# Patient Record
Sex: Male | Born: 1950
Health system: Southern US, Community
[De-identification: ages and names within clinical notes are randomized; demographics above are authoritative.]

## PROBLEM LIST (undated history)

## (undated) DIAGNOSIS — E785 Hyperlipidemia, unspecified: Secondary | ICD-10-CM

## (undated) DIAGNOSIS — I10 Essential (primary) hypertension: Secondary | ICD-10-CM

## (undated) DIAGNOSIS — R7303 Prediabetes: Secondary | ICD-10-CM

## (undated) DIAGNOSIS — R931 Abnormal findings on diagnostic imaging of heart and coronary circulation: Secondary | ICD-10-CM

## (undated) HISTORY — DX: Essential (primary) hypertension: I10

## (undated) HISTORY — DX: Abnormal findings on diagnostic imaging of heart and coronary circulation: R93.1

## (undated) HISTORY — DX: Hyperlipidemia, unspecified: E78.5

## (undated) HISTORY — DX: Prediabetes: R73.03

---

## 2012-05-06 ENCOUNTER — Other Ambulatory Visit: Payer: Self-pay | Admitting: Family Medicine

## 2012-05-06 DIAGNOSIS — R1310 Dysphagia, unspecified: Secondary | ICD-10-CM

## 2012-05-15 ENCOUNTER — Other Ambulatory Visit: Payer: Self-pay

## 2012-05-15 ENCOUNTER — Ambulatory Visit
Admission: RE | Admit: 2012-05-15 | Discharge: 2012-05-15 | Disposition: A | Discharge status: Home / Self Care | Payer: PRIVATE HEALTH INSURANCE | Source: Ambulatory Visit | Attending: Family Medicine | Admitting: Family Medicine

## 2012-05-15 DIAGNOSIS — R1310 Dysphagia, unspecified: Secondary | ICD-10-CM

## 2016-08-24 DIAGNOSIS — H26491 Other secondary cataract, right eye: Secondary | ICD-10-CM | POA: Diagnosis not present

## 2016-08-24 DIAGNOSIS — H43811 Vitreous degeneration, right eye: Secondary | ICD-10-CM | POA: Diagnosis not present

## 2016-08-24 DIAGNOSIS — H5310 Unspecified subjective visual disturbances: Secondary | ICD-10-CM | POA: Diagnosis not present

## 2016-08-24 DIAGNOSIS — H353111 Nonexudative age-related macular degeneration, right eye, early dry stage: Secondary | ICD-10-CM | POA: Diagnosis not present

## 2016-08-29 DIAGNOSIS — F411 Generalized anxiety disorder: Secondary | ICD-10-CM | POA: Diagnosis not present

## 2016-08-29 DIAGNOSIS — K469 Unspecified abdominal hernia without obstruction or gangrene: Secondary | ICD-10-CM | POA: Diagnosis not present

## 2016-08-29 DIAGNOSIS — Z125 Encounter for screening for malignant neoplasm of prostate: Secondary | ICD-10-CM | POA: Diagnosis not present

## 2016-08-29 DIAGNOSIS — Z23 Encounter for immunization: Secondary | ICD-10-CM | POA: Diagnosis not present

## 2016-08-29 DIAGNOSIS — R7303 Prediabetes: Secondary | ICD-10-CM | POA: Diagnosis not present

## 2016-08-29 DIAGNOSIS — E782 Mixed hyperlipidemia: Secondary | ICD-10-CM | POA: Diagnosis not present

## 2016-08-29 DIAGNOSIS — I10 Essential (primary) hypertension: Secondary | ICD-10-CM | POA: Diagnosis not present

## 2016-08-29 DIAGNOSIS — Z0000 Encounter for general adult medical examination without abnormal findings: Secondary | ICD-10-CM | POA: Diagnosis not present

## 2016-08-29 DIAGNOSIS — Z7689 Persons encountering health services in other specified circumstances: Secondary | ICD-10-CM | POA: Diagnosis not present

## 2016-08-29 DIAGNOSIS — N486 Induration penis plastica: Secondary | ICD-10-CM | POA: Diagnosis not present

## 2016-08-29 DIAGNOSIS — J309 Allergic rhinitis, unspecified: Secondary | ICD-10-CM | POA: Diagnosis not present

## 2016-08-29 DIAGNOSIS — K210 Gastro-esophageal reflux disease with esophagitis: Secondary | ICD-10-CM | POA: Diagnosis not present

## 2016-10-02 DIAGNOSIS — H43811 Vitreous degeneration, right eye: Secondary | ICD-10-CM | POA: Diagnosis not present

## 2016-10-16 DIAGNOSIS — J329 Chronic sinusitis, unspecified: Secondary | ICD-10-CM | POA: Diagnosis not present

## 2017-02-14 DIAGNOSIS — E782 Mixed hyperlipidemia: Secondary | ICD-10-CM | POA: Diagnosis not present

## 2017-02-14 DIAGNOSIS — I10 Essential (primary) hypertension: Secondary | ICD-10-CM | POA: Diagnosis not present

## 2017-02-14 DIAGNOSIS — R7303 Prediabetes: Secondary | ICD-10-CM | POA: Diagnosis not present

## 2017-03-06 DIAGNOSIS — H43812 Vitreous degeneration, left eye: Secondary | ICD-10-CM | POA: Diagnosis not present

## 2017-06-24 DIAGNOSIS — Z961 Presence of intraocular lens: Secondary | ICD-10-CM | POA: Diagnosis not present

## 2017-06-24 DIAGNOSIS — H35363 Drusen (degenerative) of macula, bilateral: Secondary | ICD-10-CM | POA: Diagnosis not present

## 2017-09-17 DIAGNOSIS — K4090 Unilateral inguinal hernia, without obstruction or gangrene, not specified as recurrent: Secondary | ICD-10-CM | POA: Diagnosis not present

## 2017-09-17 DIAGNOSIS — J309 Allergic rhinitis, unspecified: Secondary | ICD-10-CM | POA: Diagnosis not present

## 2017-09-17 DIAGNOSIS — I10 Essential (primary) hypertension: Secondary | ICD-10-CM | POA: Diagnosis not present

## 2017-09-17 DIAGNOSIS — N486 Induration penis plastica: Secondary | ICD-10-CM | POA: Diagnosis not present

## 2017-09-17 DIAGNOSIS — K210 Gastro-esophageal reflux disease with esophagitis: Secondary | ICD-10-CM | POA: Diagnosis not present

## 2017-09-17 DIAGNOSIS — Z125 Encounter for screening for malignant neoplasm of prostate: Secondary | ICD-10-CM | POA: Diagnosis not present

## 2017-09-17 DIAGNOSIS — Z0000 Encounter for general adult medical examination without abnormal findings: Secondary | ICD-10-CM | POA: Diagnosis not present

## 2017-09-17 DIAGNOSIS — R69 Illness, unspecified: Secondary | ICD-10-CM | POA: Diagnosis not present

## 2017-09-17 DIAGNOSIS — R7303 Prediabetes: Secondary | ICD-10-CM | POA: Diagnosis not present

## 2017-09-17 DIAGNOSIS — E782 Mixed hyperlipidemia: Secondary | ICD-10-CM | POA: Diagnosis not present

## 2017-10-26 DIAGNOSIS — E785 Hyperlipidemia, unspecified: Secondary | ICD-10-CM | POA: Diagnosis not present

## 2017-10-26 DIAGNOSIS — I10 Essential (primary) hypertension: Secondary | ICD-10-CM | POA: Diagnosis not present

## 2017-10-26 DIAGNOSIS — Z6830 Body mass index (BMI) 30.0-30.9, adult: Secondary | ICD-10-CM | POA: Diagnosis not present

## 2017-10-26 DIAGNOSIS — Z7982 Long term (current) use of aspirin: Secondary | ICD-10-CM | POA: Diagnosis not present

## 2017-10-26 DIAGNOSIS — Z823 Family history of stroke: Secondary | ICD-10-CM | POA: Diagnosis not present

## 2017-10-26 DIAGNOSIS — E669 Obesity, unspecified: Secondary | ICD-10-CM | POA: Diagnosis not present

## 2017-10-26 DIAGNOSIS — Z8249 Family history of ischemic heart disease and other diseases of the circulatory system: Secondary | ICD-10-CM | POA: Diagnosis not present

## 2017-10-26 DIAGNOSIS — J309 Allergic rhinitis, unspecified: Secondary | ICD-10-CM | POA: Diagnosis not present

## 2018-03-28 DIAGNOSIS — Z23 Encounter for immunization: Secondary | ICD-10-CM | POA: Diagnosis not present

## 2018-07-01 DIAGNOSIS — H26491 Other secondary cataract, right eye: Secondary | ICD-10-CM | POA: Diagnosis not present

## 2018-07-01 DIAGNOSIS — H353131 Nonexudative age-related macular degeneration, bilateral, early dry stage: Secondary | ICD-10-CM | POA: Diagnosis not present

## 2018-10-15 DIAGNOSIS — J309 Allergic rhinitis, unspecified: Secondary | ICD-10-CM | POA: Diagnosis not present

## 2018-10-15 DIAGNOSIS — E782 Mixed hyperlipidemia: Secondary | ICD-10-CM | POA: Diagnosis not present

## 2018-10-15 DIAGNOSIS — R7303 Prediabetes: Secondary | ICD-10-CM | POA: Diagnosis not present

## 2018-10-15 DIAGNOSIS — Z0000 Encounter for general adult medical examination without abnormal findings: Secondary | ICD-10-CM | POA: Diagnosis not present

## 2018-10-15 DIAGNOSIS — I10 Essential (primary) hypertension: Secondary | ICD-10-CM | POA: Diagnosis not present

## 2018-10-15 DIAGNOSIS — N528 Other male erectile dysfunction: Secondary | ICD-10-CM | POA: Diagnosis not present

## 2019-03-28 DIAGNOSIS — Z23 Encounter for immunization: Secondary | ICD-10-CM | POA: Diagnosis not present

## 2019-05-09 DIAGNOSIS — Z20828 Contact with and (suspected) exposure to other viral communicable diseases: Secondary | ICD-10-CM | POA: Diagnosis not present

## 2019-05-12 ENCOUNTER — Other Ambulatory Visit: Payer: Self-pay

## 2019-05-12 DIAGNOSIS — Z20822 Contact with and (suspected) exposure to covid-19: Secondary | ICD-10-CM

## 2019-05-14 LAB — NOVEL CORONAVIRUS, NAA: SARS-CoV-2, NAA: NOT DETECTED

## 2019-07-07 DIAGNOSIS — H18593 Other hereditary corneal dystrophies, bilateral: Secondary | ICD-10-CM | POA: Diagnosis not present

## 2019-07-07 DIAGNOSIS — Z961 Presence of intraocular lens: Secondary | ICD-10-CM | POA: Diagnosis not present

## 2019-07-07 DIAGNOSIS — H353131 Nonexudative age-related macular degeneration, bilateral, early dry stage: Secondary | ICD-10-CM | POA: Diagnosis not present

## 2019-07-07 DIAGNOSIS — H26493 Other secondary cataract, bilateral: Secondary | ICD-10-CM | POA: Diagnosis not present

## 2019-07-14 ENCOUNTER — Ambulatory Visit: Payer: Medicare Other | Attending: Internal Medicine

## 2019-07-14 ENCOUNTER — Other Ambulatory Visit: Payer: Self-pay

## 2019-07-14 DIAGNOSIS — Z23 Encounter for immunization: Secondary | ICD-10-CM | POA: Insufficient documentation

## 2019-07-14 NOTE — Progress Notes (Signed)
   Covid-19 Vaccination Clinic  Name:  Thomas Roach    MRN: [id number] DOB: [birth year]  07/14/2019  Thomas Roach was observed post Covid-19 immunization for 15 minutes without incidence. He was provided with Vaccine Information Sheet and instruction to access the V-Safe system.   Thomas Roach was instructed to call 911 with any severe reactions post vaccine: Marland Kitchen Difficulty breathing  . Swelling of your face and throat  . A fast heartbeat  . A bad rash all over your body  . Dizziness and weakness    Immunizations Administered    Name Date Dose VIS Date Route   Pfizer COVID-19 Vaccine 07/14/2019  1:16 PM 0.3 mL 06/05/2019 Intramuscular   Manufacturer: [place]   Lot: [id number]   [place]: [id number]

## 2019-08-05 ENCOUNTER — Ambulatory Visit: Payer: Medicare HMO | Attending: Internal Medicine

## 2019-08-05 DIAGNOSIS — Z23 Encounter for immunization: Secondary | ICD-10-CM

## 2019-08-05 NOTE — Progress Notes (Signed)
   Covid-19 Vaccination Clinic  Name:  Thomas Roach    MRN: [id number] DOB: Aug 02, 1950  08/05/2019  [name] was observed post Covid-19 immunization for 15 minutes without incidence. He was provided with Vaccine Information Sheet and instruction to access the V-Safe system.   [name] was instructed to call 911 with any severe reactions post vaccine: Marland Kitchen Difficulty breathing  . Swelling of your face and throat  . A fast heartbeat  . A bad rash all over your body  . Dizziness and weakness    Immunizations Administered    Name Date Dose VIS Date Route   Pfizer COVID-19 Vaccine 08/05/2019  8:30 AM 0.3 mL 06/05/2019 Intramuscular   Manufacturer: [place]   Lot: [id number]   [place]: [id number]

## 2019-08-07 ENCOUNTER — Ambulatory Visit: Payer: PRIVATE HEALTH INSURANCE

## 2019-10-16 DIAGNOSIS — M25552 Pain in left hip: Secondary | ICD-10-CM | POA: Diagnosis not present

## 2019-10-16 DIAGNOSIS — J309 Allergic rhinitis, unspecified: Secondary | ICD-10-CM | POA: Diagnosis not present

## 2019-10-16 DIAGNOSIS — D225 Melanocytic nevi of trunk: Secondary | ICD-10-CM | POA: Diagnosis not present

## 2019-10-16 DIAGNOSIS — R7309 Other abnormal glucose: Secondary | ICD-10-CM | POA: Diagnosis not present

## 2019-10-16 DIAGNOSIS — E782 Mixed hyperlipidemia: Secondary | ICD-10-CM | POA: Diagnosis not present

## 2019-10-16 DIAGNOSIS — Z0000 Encounter for general adult medical examination without abnormal findings: Secondary | ICD-10-CM | POA: Diagnosis not present

## 2019-10-16 DIAGNOSIS — L989 Disorder of the skin and subcutaneous tissue, unspecified: Secondary | ICD-10-CM | POA: Diagnosis not present

## 2019-10-16 DIAGNOSIS — Z125 Encounter for screening for malignant neoplasm of prostate: Secondary | ICD-10-CM | POA: Diagnosis not present

## 2019-10-16 DIAGNOSIS — I10 Essential (primary) hypertension: Secondary | ICD-10-CM | POA: Diagnosis not present

## 2019-10-16 DIAGNOSIS — N528 Other male erectile dysfunction: Secondary | ICD-10-CM | POA: Diagnosis not present

## 2019-11-18 DIAGNOSIS — H0012 Chalazion right lower eyelid: Secondary | ICD-10-CM | POA: Diagnosis not present

## 2019-11-25 DIAGNOSIS — L821 Other seborrheic keratosis: Secondary | ICD-10-CM | POA: Diagnosis not present

## 2019-11-25 DIAGNOSIS — D2261 Melanocytic nevi of right upper limb, including shoulder: Secondary | ICD-10-CM | POA: Diagnosis not present

## 2019-11-25 DIAGNOSIS — L814 Other melanin hyperpigmentation: Secondary | ICD-10-CM | POA: Diagnosis not present

## 2019-11-25 DIAGNOSIS — D692 Other nonthrombocytopenic purpura: Secondary | ICD-10-CM | POA: Diagnosis not present

## 2019-11-25 DIAGNOSIS — D2371 Other benign neoplasm of skin of right lower limb, including hip: Secondary | ICD-10-CM | POA: Diagnosis not present

## 2019-11-25 DIAGNOSIS — D225 Melanocytic nevi of trunk: Secondary | ICD-10-CM | POA: Diagnosis not present

## 2019-11-25 DIAGNOSIS — D2271 Melanocytic nevi of right lower limb, including hip: Secondary | ICD-10-CM | POA: Diagnosis not present

## 2019-11-25 DIAGNOSIS — D2262 Melanocytic nevi of left upper limb, including shoulder: Secondary | ICD-10-CM | POA: Diagnosis not present

## 2020-02-17 DIAGNOSIS — Z20822 Contact with and (suspected) exposure to covid-19: Secondary | ICD-10-CM | POA: Diagnosis not present

## 2020-04-02 DIAGNOSIS — Z23 Encounter for immunization: Secondary | ICD-10-CM | POA: Diagnosis not present

## 2020-04-23 ENCOUNTER — Ambulatory Visit: Payer: Medicare HMO | Attending: Internal Medicine

## 2020-04-23 ENCOUNTER — Other Ambulatory Visit: Payer: Self-pay

## 2020-04-23 DIAGNOSIS — Z23 Encounter for immunization: Secondary | ICD-10-CM

## 2020-04-23 NOTE — Progress Notes (Signed)
   Covid-19 Vaccination Clinic  Name:  Thomas Roach    MRN: [id number] DOB: [birth year]  04/23/2020  Mr. Thomas Roach was observed post Covid-19 immunization for 15 minutes without incident. He was provided with Vaccine Information Sheet and instruction to access the V-Safe system.   Mr. Thomas Roach was instructed to call 911 with any severe reactions post vaccine: Marland Kitchen Difficulty breathing  . Swelling of face and throat  . A fast heartbeat  . A bad rash all over body  . Dizziness and weakness

## 2020-07-12 DIAGNOSIS — H26492 Other secondary cataract, left eye: Secondary | ICD-10-CM | POA: Diagnosis not present

## 2020-07-12 DIAGNOSIS — Z961 Presence of intraocular lens: Secondary | ICD-10-CM | POA: Diagnosis not present

## 2020-10-24 DIAGNOSIS — R7303 Prediabetes: Secondary | ICD-10-CM | POA: Diagnosis not present

## 2020-10-24 DIAGNOSIS — J309 Allergic rhinitis, unspecified: Secondary | ICD-10-CM | POA: Diagnosis not present

## 2020-10-24 DIAGNOSIS — I10 Essential (primary) hypertension: Secondary | ICD-10-CM | POA: Diagnosis not present

## 2020-10-24 DIAGNOSIS — Z1211 Encounter for screening for malignant neoplasm of colon: Secondary | ICD-10-CM | POA: Diagnosis not present

## 2020-10-24 DIAGNOSIS — S80219A Abrasion, unspecified knee, initial encounter: Secondary | ICD-10-CM | POA: Diagnosis not present

## 2020-10-24 DIAGNOSIS — R002 Palpitations: Secondary | ICD-10-CM | POA: Diagnosis not present

## 2020-10-24 DIAGNOSIS — Z125 Encounter for screening for malignant neoplasm of prostate: Secondary | ICD-10-CM | POA: Diagnosis not present

## 2020-10-24 DIAGNOSIS — N528 Other male erectile dysfunction: Secondary | ICD-10-CM | POA: Diagnosis not present

## 2020-10-24 DIAGNOSIS — Z0000 Encounter for general adult medical examination without abnormal findings: Secondary | ICD-10-CM | POA: Diagnosis not present

## 2020-10-24 DIAGNOSIS — E782 Mixed hyperlipidemia: Secondary | ICD-10-CM | POA: Diagnosis not present

## 2020-10-24 DIAGNOSIS — Z23 Encounter for immunization: Secondary | ICD-10-CM | POA: Diagnosis not present

## 2020-10-25 ENCOUNTER — Other Ambulatory Visit: Payer: Self-pay | Admitting: Family Medicine

## 2020-10-25 DIAGNOSIS — R002 Palpitations: Secondary | ICD-10-CM

## 2020-11-02 DIAGNOSIS — H26492 Other secondary cataract, left eye: Secondary | ICD-10-CM | POA: Diagnosis not present

## 2020-11-18 ENCOUNTER — Ambulatory Visit
Admission: RE | Admit: 2020-11-18 | Discharge: 2020-11-18 | Disposition: A | Discharge status: Home / Self Care | Payer: No Typology Code available for payment source | Source: Ambulatory Visit | Attending: Family Medicine | Admitting: Family Medicine

## 2020-11-18 DIAGNOSIS — R002 Palpitations: Secondary | ICD-10-CM

## 2020-11-18 DIAGNOSIS — E785 Hyperlipidemia, unspecified: Secondary | ICD-10-CM | POA: Diagnosis not present

## 2021-03-27 ENCOUNTER — Telehealth (INDEPENDENT_AMBULATORY_CARE_PROVIDER_SITE_OTHER): Payer: Medicare HMO | Admitting: Internal Medicine

## 2021-03-27 ENCOUNTER — Encounter (HOSPITAL_BASED_OUTPATIENT_CLINIC_OR_DEPARTMENT_OTHER): Payer: Self-pay | Admitting: Internal Medicine

## 2021-03-27 ENCOUNTER — Other Ambulatory Visit: Payer: Self-pay | Admitting: Family Medicine

## 2021-03-27 VITALS — Ht 70.0 in | Wt 170.0 lb

## 2021-03-27 DIAGNOSIS — E782 Mixed hyperlipidemia: Secondary | ICD-10-CM

## 2021-03-27 DIAGNOSIS — I10 Essential (primary) hypertension: Secondary | ICD-10-CM

## 2021-03-27 DIAGNOSIS — I2510 Atherosclerotic heart disease of native coronary artery without angina pectoris: Secondary | ICD-10-CM | POA: Diagnosis not present

## 2021-03-27 DIAGNOSIS — I2584 Coronary atherosclerosis due to calcified coronary lesion: Secondary | ICD-10-CM | POA: Diagnosis not present

## 2021-03-27 DIAGNOSIS — R7303 Prediabetes: Secondary | ICD-10-CM | POA: Diagnosis not present

## 2021-03-27 DIAGNOSIS — R1310 Dysphagia, unspecified: Secondary | ICD-10-CM

## 2021-03-27 NOTE — Patient Instructions (Signed)
Medication Instructions:  NO CHANGES until after lab results/genetic test  *If you need a refill on your cardiac medications before your next appointment, please call your pharmacy*   Lab Work: FASTING lab work to check cholesterol - NMR lipoprofile, LP(a)  If you have labs (blood work) drawn today and your tests are completely normal, you will receive your results only by: [place] (if you have MyChart) OR A paper copy in the mail If you have any lab test that is abnormal or we need to change your treatment, we will call you to review the results.   Testing/Procedures: Genetic Test (will be scheduled with Clarnce Flock., RN @ [address] [place])   Follow-Up: At [facility], you and your health needs are our priority.  As part of our continuing mission to provide you with exceptional heart care, we have created designated Provider Care Teams.  These Care Teams include your primary Cardiologist (physician) and Advanced Practice Providers (APPs -  Physician Assistants and Nurse Practitioners) who all work together to provide you with the care you need, when you need it.  We recommend signing up for the patient portal called "MyChart".  Sign up information is provided on this After Visit Summary.  MyChart is used to connect with patients for Virtual Visits (Telemedicine).  Patients are able to view lab/test results, encounter notes, upcoming appointments, etc.  Non-urgent messages can be sent to your provider as well.   To learn more about what you can do with MyChart, go to [email or web address].    Your next appointment:   4 month(s) - lipid clinic  The format for your next appointment:   In Person  Provider:   K. Mali Hilty, MD   Other Instructions

## 2021-03-29 ENCOUNTER — Ambulatory Visit
Admission: RE | Admit: 2021-03-29 | Discharge: 2021-03-29 | Disposition: A | Discharge status: Home / Self Care | Payer: Medicare HMO | Source: Ambulatory Visit | Attending: Family Medicine | Admitting: Family Medicine

## 2021-03-29 DIAGNOSIS — R1310 Dysphagia, unspecified: Secondary | ICD-10-CM

## 2021-03-29 DIAGNOSIS — R1312 Dysphagia, oropharyngeal phase: Secondary | ICD-10-CM | POA: Diagnosis not present

## 2021-03-31 ENCOUNTER — Encounter (HOSPITAL_BASED_OUTPATIENT_CLINIC_OR_DEPARTMENT_OTHER): Payer: Self-pay | Admitting: Internal Medicine

## 2021-03-31 NOTE — Progress Notes (Addendum)
Virtual Visit via Video Note   This visit type was conducted due to national recommendations for restrictions regarding the COVID-19 Pandemic (e.g. social distancing) in an effort to limit this patient's exposure and mitigate transmission in our community.  Due to his co-morbid illnesses, this patient is at least at moderate risk for complications without adequate follow up.  This format is felt to be most appropriate for this patient at this time.  All issues noted in this document were discussed and addressed.  A limited physical exam was performed with this format.  Please refer to the patient's chart for his consent to telehealth for [facility].      Date:  03/31/2021   ID:  Thomas Roach, DOB 1951/03/05, MRN [id number] The patient was identified using 2 identifiers.  Evaluation Performed:  New Patient Evaluation  Patient Location:  [place] [address]  Provider location:   [address], [place], [place] [address]  PCP:  Mayra Neer, MD  Cardiologist:  None Electrophysiologist:  None   Chief Complaint: Abnormal calcium score  History of Present Illness:    Thomas Roach is a 70 y.o. male who presents via audio/video conferencing for a telehealth visit today.  Thomas Roach is being seen today for the evaluation of abnormal calcium score at the request of Mayra Neer, MD. this is a pleasant 69 year old male kindly referred for evaluation management of dyslipidemia, hypertension, prediabetes and an abnormal coronary calcium score.  He underwent calcium scoring in May 2022 which demonstrated a total score of 217, 59th percentile for age and matched controls.  Based on these abnormal findings he was referred for further evaluation and management.  Calcium was noted on that study to be in the LAD and circumflex arteries.  He does report family history of cardiovascular disease including his father who died of a stroke in his 12s but his mother lived to  44.  Most recent labs showed total cholesterol 160, triglycerides 60, HDL 74 and LDL of 73.  The patient does not have symptoms concerning for COVID-19 infection (fever, chills, cough, or new SHORTNESS OF BREATH).    Prior CV studies:   The following studies were reviewed today:  Chart reviewed, labwork  PMHx:  Past Medical History:  Diagnosis Date   Dyslipidemia    Elevated coronary artery calcium score    Hypertension    Prediabetes     History reviewed. No pertinent surgical history.  FAMHx:  Family History  Problem Relation Age of Onset   Stroke Father     SOCHx:   has no history on file for tobacco use, alcohol use, and drug use.  ALLERGIES:  Not on File  MEDS:  Current Meds  Medication Sig   amoxicillin (AMOXIL) 500 MG capsule Take 1 capsule by mouth in the morning, at noon, and at bedtime.   aspirin 81 MG EC tablet Take 1 tablet by mouth daily at 12 noon.   lisinopril-hydrochlorothiazide (ZESTORETIC) 20-12.5 MG tablet Take 1 tablet by mouth daily.   loratadine (CLARITIN) 10 MG tablet Take 10 mg by mouth daily as needed for allergies.   simvastatin (ZOCOR) 10 MG tablet Take 5 mg by mouth at bedtime.     ROS: Pertinent items noted in HPI and remainder of comprehensive ROS otherwise negative.  Labs/Other Tests and Data Reviewed:    Recent Labs: No results found for requested labs within last 8760 hours.   Recent Lipid Panel No results found for:  CHOL, TRIG, HDL, CHOLHDL, LDLCALC, LDLDIRECT  Wt Readings from Last 3 Encounters:  03/27/21 170 lb (77.1 kg)     Exam:    Vital Signs:  Ht 5\' 10"  (1.778 m)   Wt 170 lb (77.1 kg)   BMI 24.39 kg/m    General appearance: alert and no distress Neck: no carotid bruit, no JVD, and thyroid not enlarged, symmetric, no tenderness/mass/nodules Lungs: clear to auscultation bilaterally Heart: regular rate and rhythm, S1, S2 normal, no murmur, click, rub or gallop Abdomen: soft, non-tender; bowel sounds normal; no  masses,  no organomegaly Extremities: extremities normal, atraumatic, no cyanosis or edema Pulses: 2+ and symmetric Skin: Skin color, texture, turgor normal. No rashes or lesions Neurologic: Grossly normal Psych: Pleasant   ASSESSMENT & PLAN:    Mixed dyslipidemia, goal LDL less than 70 Abnormal CAC score of 217, 59th percentile since 10/2020) Hypertension Prediabetes  Thomas Roach has an abnormal coronary calcium score which puts them at a more moderate risk.  He has a dyslipidemia and I would target LDL cholesterol to less than 70 given concomitant hypertension and prediabetes.  He was surprised at the degree of coronary calcium that he had with overall pretty good lipid control.  He is interested in more lab work including fasting NMR and will check an LP(a).  We will also send out for genetic testing today.  Plan follow-up with me in about 4 months and based on additional testing, we may need to further adjust his medical therapy.  For now, continue low-dose simvastatin 5 mg nightly.  Thanks again for the kind referral.  COVID-19 Education: The signs and symptoms of COVID-19 were discussed with the patient and how to seek care for testing (follow up with PCP or arrange E-visit).  The importance of social distancing was discussed today.  Patient Risk:   After full review of this patients clinical status, I feel that they are at least moderate risk at this time.  Time:   Today, I have spent 25 minutes with the patient with telehealth technology discussing calcium scoring, dyslipidemia, genetic testing.     Medication Adjustments/Labs and Tests Ordered: Current medicines are reviewed at length with the patient today.  Concerns regarding medicines are outlined above.   Tests Ordered: Orders Placed This Encounter  Procedures   Lipoprotein A (LPA)   NMR, lipoprofile    Medication Changes: No orders of the defined types were placed in this encounter.   Disposition:  in 4  month(s)  Pixie Casino, MD, [facility], [place] Director of the Advanced Lipid Disorders &  Cardiovascular Risk Reduction Clinic Diplomate of the American Board of Clinical Lipidology Attending Cardiologist  Direct Dial: [phone]  Fax: [phone]  Website:  [email or web address]  Pixie Casino, MD  03/31/2021 9:51 PM

## 2021-04-04 ENCOUNTER — Ambulatory Visit: Payer: Medicare HMO | Admitting: *Deleted

## 2021-04-04 ENCOUNTER — Other Ambulatory Visit: Payer: Self-pay

## 2021-04-04 DIAGNOSIS — E782 Mixed hyperlipidemia: Secondary | ICD-10-CM | POA: Diagnosis not present

## 2021-04-04 DIAGNOSIS — I2584 Coronary atherosclerosis due to calcified coronary lesion: Secondary | ICD-10-CM | POA: Diagnosis not present

## 2021-04-04 DIAGNOSIS — I2510 Atherosclerotic heart disease of native coronary artery without angina pectoris: Secondary | ICD-10-CM | POA: Diagnosis not present

## 2021-04-04 NOTE — Progress Notes (Signed)
Patient of Dr. Debara Pickett presents for genetic test screening. Patient completed virtual visit on 03/27/21 with MD and genetic test was requested - ASCVD, dyslipidemia panel. Buccal swab completed. Explained that results are available in 3-4 weeks and he will be notified once MD reviews. Explained that cost of test (if not covered w/insurance) is $299.

## 2021-04-05 LAB — NMR, LIPOPROFILE
Cholesterol, Total: 184 mg/dL (ref 100–199)
HDL Particle Number: 36.2 umol/L (ref >=30.5)
HDL-C: 70 mg/dL (ref >39)
LDL Particle Number: 934 nmol/L (ref <1000)
LDL Size: 21.3 nm (ref >20.5)
LDL-C (NIH Calc): 100 mg/dL — ABNORMAL HIGH (ref 0–99)
LP-IR Score: 29 (ref <=45)
Small LDL Particle Number: 319 nmol/L (ref <=527)
Triglycerides: 75 mg/dL (ref 0–149)

## 2021-04-05 LAB — LIPOPROTEIN A (LPA): Lipoprotein (a): 10 nmol/L (ref <75.0)

## 2021-04-06 DIAGNOSIS — E782 Mixed hyperlipidemia: Secondary | ICD-10-CM

## 2021-04-06 MED ORDER — SIMVASTATIN 20 MG PO TABS: 20.0000 mg | ORAL_TABLET | Freq: Every evening | ORAL | 3 refills | Status: DC

## 2021-04-29 DIAGNOSIS — Z23 Encounter for immunization: Secondary | ICD-10-CM | POA: Diagnosis not present

## 2021-07-04 DIAGNOSIS — E782 Mixed hyperlipidemia: Secondary | ICD-10-CM | POA: Diagnosis not present

## 2021-07-05 LAB — NMR, LIPOPROFILE
Cholesterol, Total: 144 mg/dL (ref 100–199)
HDL Particle Number: 35 umol/L (ref >=30.5)
HDL-C: 68 mg/dL (ref >39)
LDL Particle Number: 754 nmol/L (ref <1000)
LDL Size: 20.4 nm — ABNORMAL LOW (ref >20.5)
LDL-C (NIH Calc): 64 mg/dL (ref 0–99)
LP-IR Score: 43 (ref <=45)
Small LDL Particle Number: 338 nmol/L (ref <=527)
Triglycerides: 56 mg/dL (ref 0–149)

## 2021-07-13 ENCOUNTER — Ambulatory Visit: Payer: Medicare HMO | Admitting: Internal Medicine

## 2021-07-13 ENCOUNTER — Other Ambulatory Visit: Payer: Self-pay

## 2021-07-13 ENCOUNTER — Encounter: Payer: Self-pay | Admitting: Internal Medicine

## 2021-07-13 VITALS — BP 135/76 | HR 77 | Ht 70.0 in | Wt 172.2 lb

## 2021-07-13 DIAGNOSIS — E782 Mixed hyperlipidemia: Secondary | ICD-10-CM | POA: Diagnosis not present

## 2021-07-13 DIAGNOSIS — R918 Other nonspecific abnormal finding of lung field: Secondary | ICD-10-CM

## 2021-07-13 NOTE — Patient Instructions (Signed)
Medication Instructions:  NO CHANGES  *If you need a refill on your cardiac medications before your next appointment, please call your pharmacy*   Lab Work: FASTING lab work in 1 year to check cholesterol -- complete about 1 week before next appointment   If you have labs (blood work) drawn today and your tests are completely normal, you will receive your results only by: [place] (if you have MyChart) OR A paper copy in the mail If you have any lab test that is abnormal or we need to change your treatment, we will call you to review the results.   Follow-Up: At [facility] [place], you and your health needs are our priority.  As part of our continuing mission to provide you with exceptional heart care, we have created designated Provider Care Teams.  These Care Teams include your primary Cardiologist (physician) and Advanced Practice Providers (APPs -  Physician Assistants and Nurse Practitioners) who all work together to provide you with the care you need, when you need it.  We recommend signing up for the patient portal called "MyChart".  Sign up information is provided on this After Visit Summary.  MyChart is used to connect with patients for Virtual Visits (Telemedicine).  Patients are able to view lab/test results, encounter notes, upcoming appointments, etc.  Non-urgent messages can be sent to your provider as well.   To learn more about what you can do with MyChart, go to [email or web address].    Your next appointment:   1 year with Dr. Debara Pickett

## 2021-07-13 NOTE — Progress Notes (Signed)
LIPID CLINIC NOTE  Chief Complaint:  Follow-up dyslipidemia  Primary Care Physician: Mayra Neer, MD  Primary Cardiologist:  None  HPI:  Thomas Roach is a 71 y.o. male who is being seen today for the evaluation of dyslipidemia at the request of Mayra Neer, MD. this is a pleasant 71 year old male kindly referred for evaluation management of dyslipidemia, hypertension, prediabetes and an abnormal coronary calcium score.  He underwent calcium scoring in May 2022 which demonstrated a total score of 217, 59th percentile for age and matched controls.  Based on these abnormal findings he was referred for further evaluation and management.  Calcium was noted on that study to be in the LAD and circumflex arteries.  He does report family history of cardiovascular disease including his father who died of a stroke in his 60s but his mother lived to 41.  Most recent labs showed total cholesterol 160, triglycerides 60, HDL 74 and LDL of 73.  07/13/2021  Mr. Standard returns today for follow-up.  There is been some discrepancy between the labs he has gotten at [facility] versus through [facility].  Most recently I ordered a lipid NMR which showed LDL-P of 934, total cholesterol 100, HDL 70 and triglycerides 184.  This is now improved to LDL-P of 754, LDL-C 64, HDL 68 and triglycerides 144, in response to an increase in his simvastatin up to 20 mg.  He is now at target.  He was noted to have coronary calcium which was the reason for referral as above and found to have some subcentimeter pulmonary nodules.  Follow-up was recommended if he is considered high risk but generally I would recommend a follow-up CT either way.  He did undergo genetic testing for FH which showed no pathogenic mutations although he did have a variant of unknown significance which increases his risk of heart disease and 3 mutations that were mixed regarding risk for obesity.  PMHx:  Past Medical History:  Diagnosis Date    Dyslipidemia    Elevated coronary artery calcium score    Hypertension    Prediabetes     No past surgical history on file.  FAMHx:  Family History  Problem Relation Age of Onset   Stroke Father     SOCHx:   reports that he has never smoked. He has never used smokeless tobacco. He reports current alcohol use. He reports that he does not use drugs.  ALLERGIES:  Not on File  ROS: Pertinent items noted in HPI and remainder of comprehensive ROS otherwise negative.  HOME MEDS: Current Outpatient Medications on File Prior to Visit  Medication Sig Dispense Refill   aspirin 81 MG EC tablet Take 1 tablet by mouth daily at 12 noon.     lisinopril-hydrochlorothiazide (ZESTORETIC) 20-12.5 MG tablet Take 1 tablet by mouth daily.     loratadine (CLARITIN) 10 MG tablet Take 10 mg by mouth daily as needed for allergies.     simvastatin (ZOCOR) 20 MG tablet Take 1 tablet (20 mg total) by mouth at bedtime. 90 tablet 3   No current facility-administered medications on file prior to visit.    LABS/IMAGING: No results found for this or any previous visit (from the past 48 hour(s)). No results found.  LIPID PANEL: No results found for: CHOL, TRIG, HDL, CHOLHDL, VLDL, LDLCALC, LDLDIRECT  WEIGHTS: Wt Readings from Last 3 Encounters:  07/13/21 172 lb 3.2 oz (78.1 kg)  03/27/21 170 lb (77.1 kg)    VITALS: BP 135/76    Pulse  77    Ht 5\' 10"  (1.778 m)    Wt 172 lb 3.2 oz (78.1 kg)    SpO2 99%    BMI 24.71 kg/m   EXAM: Deferred  EKG: Deferred  ASSESSMENT: Mixed dyslipidemia, goal LDL less than 70 Multivessel coronary calcium-CAC score 217, 59th percentile (10/2020) Subcentimeter pulmonary nodules Hypertension Prediabetes  PLAN: 1.   Mr. Lech is now at target on his current dose of simvastatin.  He is also taking low-dose aspirin and Zestoretic with good blood pressure control.  Overall I think this is the best we can ask for at this point.  We will need to continue to monitor him  possibly for symptoms.  I advised to follow-up CT of the chest just to make sure that there is no change in his subcentimeter nodules in May.  We will plan follow-up annually or sooner as necessary.  Pixie Casino, MD, [facility], [place] Director of the Advanced Lipid Disorders &  Cardiovascular Risk Reduction Clinic Diplomate of the American Board of Clinical Lipidology Attending Cardiologist  Direct Dial: [phone]   Fax: [phone]  Website:  [email or web address] 07/13/2021, 8:42 PM

## 2021-07-19 DIAGNOSIS — H18593 Other hereditary corneal dystrophies, bilateral: Secondary | ICD-10-CM | POA: Diagnosis not present

## 2021-07-19 DIAGNOSIS — Z961 Presence of intraocular lens: Secondary | ICD-10-CM | POA: Diagnosis not present

## 2021-07-19 DIAGNOSIS — H353131 Nonexudative age-related macular degeneration, bilateral, early dry stage: Secondary | ICD-10-CM | POA: Diagnosis not present

## 2021-07-19 DIAGNOSIS — H52203 Unspecified astigmatism, bilateral: Secondary | ICD-10-CM | POA: Diagnosis not present

## 2021-08-08 DIAGNOSIS — K5730 Diverticulosis of large intestine without perforation or abscess without bleeding: Secondary | ICD-10-CM | POA: Diagnosis not present

## 2021-08-08 DIAGNOSIS — K649 Unspecified hemorrhoids: Secondary | ICD-10-CM | POA: Diagnosis not present

## 2021-08-08 DIAGNOSIS — Z86010 Personal history of colonic polyps: Secondary | ICD-10-CM | POA: Diagnosis not present

## 2021-08-08 DIAGNOSIS — D123 Benign neoplasm of transverse colon: Secondary | ICD-10-CM | POA: Diagnosis not present

## 2021-08-08 DIAGNOSIS — D125 Benign neoplasm of sigmoid colon: Secondary | ICD-10-CM | POA: Diagnosis not present

## 2021-08-11 DIAGNOSIS — D125 Benign neoplasm of sigmoid colon: Secondary | ICD-10-CM | POA: Diagnosis not present

## 2021-08-11 DIAGNOSIS — D123 Benign neoplasm of transverse colon: Secondary | ICD-10-CM | POA: Diagnosis not present

## 2021-09-05 ENCOUNTER — Encounter: Payer: Self-pay | Admitting: Internal Medicine

## 2021-09-08 ENCOUNTER — Other Ambulatory Visit (HOSPITAL_BASED_OUTPATIENT_CLINIC_OR_DEPARTMENT_OTHER): Payer: Self-pay

## 2021-09-08 MED ORDER — ZOSTER VAC RECOMB ADJUVANTED 50 MCG/0.5ML IM SUSR
INTRAMUSCULAR | 1 refills | Status: AC
  Filled 2021-09-08: qty 0.5, 1d supply, fill #0
  Filled 2021-12-08: qty 0.5, 1d supply, fill #1

## 2021-10-31 DIAGNOSIS — Z125 Encounter for screening for malignant neoplasm of prostate: Secondary | ICD-10-CM | POA: Diagnosis not present

## 2021-10-31 DIAGNOSIS — J309 Allergic rhinitis, unspecified: Secondary | ICD-10-CM | POA: Diagnosis not present

## 2021-10-31 DIAGNOSIS — N528 Other male erectile dysfunction: Secondary | ICD-10-CM | POA: Diagnosis not present

## 2021-10-31 DIAGNOSIS — R911 Solitary pulmonary nodule: Secondary | ICD-10-CM | POA: Diagnosis not present

## 2021-10-31 DIAGNOSIS — Z0000 Encounter for general adult medical examination without abnormal findings: Secondary | ICD-10-CM | POA: Diagnosis not present

## 2021-10-31 DIAGNOSIS — R7303 Prediabetes: Secondary | ICD-10-CM | POA: Diagnosis not present

## 2021-10-31 DIAGNOSIS — I10 Essential (primary) hypertension: Secondary | ICD-10-CM | POA: Diagnosis not present

## 2021-10-31 DIAGNOSIS — I2510 Atherosclerotic heart disease of native coronary artery without angina pectoris: Secondary | ICD-10-CM | POA: Diagnosis not present

## 2021-10-31 DIAGNOSIS — E782 Mixed hyperlipidemia: Secondary | ICD-10-CM | POA: Diagnosis not present

## 2021-11-01 ENCOUNTER — Other Ambulatory Visit: Payer: Medicare HMO

## 2021-11-14 ENCOUNTER — Ambulatory Visit
Admission: RE | Admit: 2021-11-14 | Discharge: 2021-11-14 | Disposition: A | Discharge status: Home / Self Care | Payer: Medicare HMO | Source: Ambulatory Visit | Attending: Internal Medicine | Admitting: Internal Medicine

## 2021-11-14 ENCOUNTER — Inpatient Hospital Stay: Admission: RE | Admit: 2021-11-14 | Payer: Medicare HMO | Source: Ambulatory Visit

## 2021-11-14 DIAGNOSIS — R918 Other nonspecific abnormal finding of lung field: Secondary | ICD-10-CM | POA: Diagnosis not present

## 2021-11-14 DIAGNOSIS — R911 Solitary pulmonary nodule: Secondary | ICD-10-CM | POA: Diagnosis not present

## 2021-11-14 DIAGNOSIS — I700 Atherosclerosis of aorta: Secondary | ICD-10-CM | POA: Diagnosis not present

## 2021-12-08 ENCOUNTER — Other Ambulatory Visit (HOSPITAL_BASED_OUTPATIENT_CLINIC_OR_DEPARTMENT_OTHER): Payer: Self-pay

## 2021-12-19 ENCOUNTER — Other Ambulatory Visit (HOSPITAL_BASED_OUTPATIENT_CLINIC_OR_DEPARTMENT_OTHER): Payer: Self-pay

## 2022-04-23 ENCOUNTER — Encounter: Payer: Self-pay | Admitting: Physician Assistant

## 2022-04-23 ENCOUNTER — Ambulatory Visit: Payer: Medicare HMO | Admitting: Physician Assistant

## 2022-04-23 ENCOUNTER — Ambulatory Visit (INDEPENDENT_AMBULATORY_CARE_PROVIDER_SITE_OTHER): Payer: Medicare HMO

## 2022-04-23 DIAGNOSIS — M7671 Peroneal tendinitis, right leg: Secondary | ICD-10-CM | POA: Diagnosis not present

## 2022-04-23 DIAGNOSIS — M79671 Pain in right foot: Secondary | ICD-10-CM

## 2022-04-23 MED ORDER — METHYLPREDNISOLONE 4 MG PO TABS: ORAL_TABLET | ORAL | 0 refills | Status: AC

## 2022-04-23 NOTE — Progress Notes (Signed)
Office Visit Note   Patient: Thomas Roach           Date of Birth: Apr 26, 1951           MRN: [id number] Visit Date: 04/23/2022              Requested by: Mayra Neer, MD [address] [place],  [place] [address] PCP: Mayra Neer, MD   Assessment & Plan: Visit Diagnoses:  1. Pain in right foot   2. Peroneal tendinitis of right lower extremity     Plan: He will get a facility type orthotic with a lateral hindfoot wedge.  He is to begin back shoes are changed daily.  Voltaren gel 4 g 4 times daily to the area of maximal tenderness.  Placed him on a Medrol Dosepak.  If pain persist or becomes worse he can always return otherwise follow-up as needed.  Follow-Up Instructions: Return if symptoms worsen or fail to improve.   Orders:  Orders Placed This Encounter  Procedures   XR Foot Complete Right   Meds ordered this encounter  Medications   methylPREDNISolone (MEDROL) 4 MG tablet    Sig: Take as directed per box instructions    Dispense:  21 tablet    Refill:  0      Procedures: No procedures performed   Clinical Data: No additional findings.   Subjective: Chief Complaint  Patient presents with   Right Foot - Pain    HPI Mr. Thomas Roach is a pleasant 71 year old male comes in today with right foot pain.  Pain is lateral aspect foot is been ongoing for the past 4 weeks no injury.  He describes the pain as a stabbing pain.  Denies any numbness tingling.  Ranks pain to be 6 out of 10 pain at worst.  He does note with his hiking shoes that his foot feels better.  Denies any swelling.  He is nondiabetic. Review of Systems See HPI  Objective: Vital Signs: There were no vitals taken for this visit.  Physical Exam Constitutional:      Appearance: He is not ill-appearing or diaphoretic.  Pulmonary:     Effort: Pulmonary effort is normal.  Neurological:     Mental Status: He is alert and oriented to person, place, and time.  Psychiatric:        Mood  and Affect: Mood normal.     Ortho Exam Bilateral feet no rashes skin lesions or ulcerations impending ulcers.  Right foot tenderness over the peroneal's brevis insertion otherwise nontender.  There is no ecchymosis by the foot.   5 out of 5 strength with inversion eversion against resistance without pain.  Achilles is intact bilaterally and nontender.  Cavus type feet bilaterally. Specialty Comments:  No specialty comments available.  Imaging: XR Foot Complete Right  Result Date: 04/23/2022 Right foot 3 views: No acute fractures.  Sesamoids well located.  No significant arthritic changes.  Normal bone density.    PMFS History: There are no problems to display for this patient.  Past Medical History:  Diagnosis Date   Dyslipidemia    Elevated coronary artery calcium score    Hypertension    Prediabetes     Family History  Problem Relation Age of Onset   Stroke Father     History reviewed. No pertinent surgical history. Social History   Occupational History   Not on file  Tobacco Use   Smoking status: Never   Smokeless tobacco: Never  Substance and  Sexual Activity   Alcohol use: Yes   Drug use: Never   Sexual activity: Yes

## 2022-04-28 DIAGNOSIS — Z23 Encounter for immunization: Secondary | ICD-10-CM | POA: Diagnosis not present

## 2022-05-15 ENCOUNTER — Other Ambulatory Visit: Payer: Self-pay | Admitting: *Deleted

## 2022-05-15 DIAGNOSIS — E782 Mixed hyperlipidemia: Secondary | ICD-10-CM

## 2022-05-22 ENCOUNTER — Other Ambulatory Visit: Payer: Self-pay | Admitting: Internal Medicine

## 2022-06-06 IMAGING — CT CT CHEST W/O CM
2 of 5 series · 15 of 36 positions shown, 18 images · non-contrast
Comparison: 11/18/2020

CLINICAL DATA: Follow-up lung nodules incidentally identified by
cardiac calcium scoring CT



[Series 4: chest 2.00 br40 s3 · coronal · 0.62mm/px · 3 of 154 slices shown]
[im 31/154  lung]
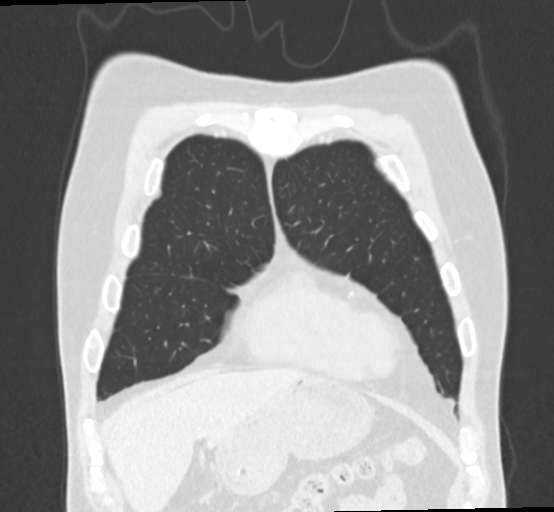
[im 62/154  lung]
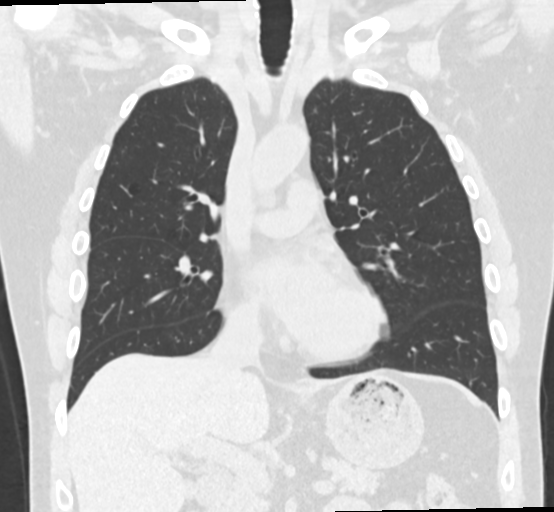
[im 92/154  lung]
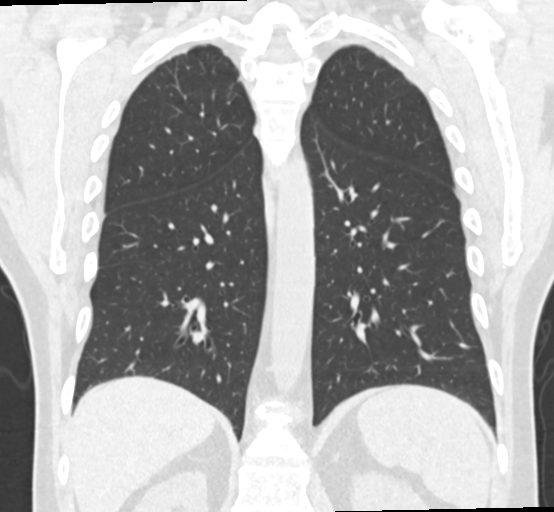

[Series 10: chest 1.00 br40 s3 super d · axial · 0.75mm/px · z∈[+1531,+1804]mm · 12 of 395 slices shown, 15 images]
[im 27/395  mediastinal]
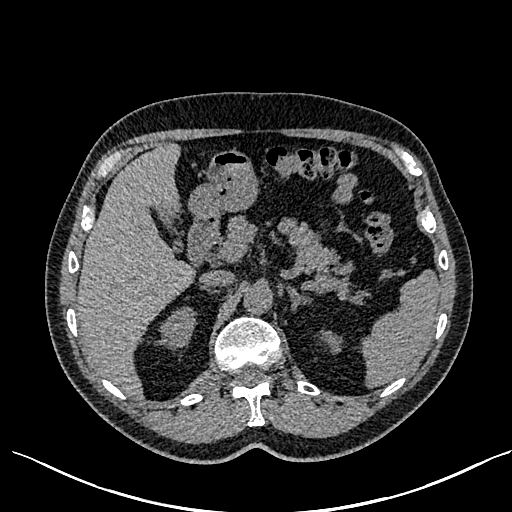
[im 27/395  lung]
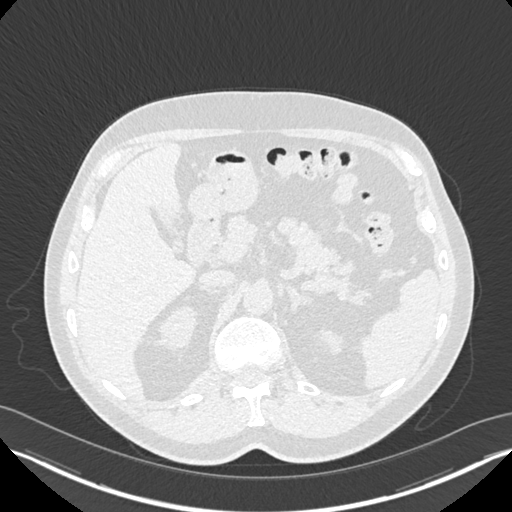
[im 53/395  lung]
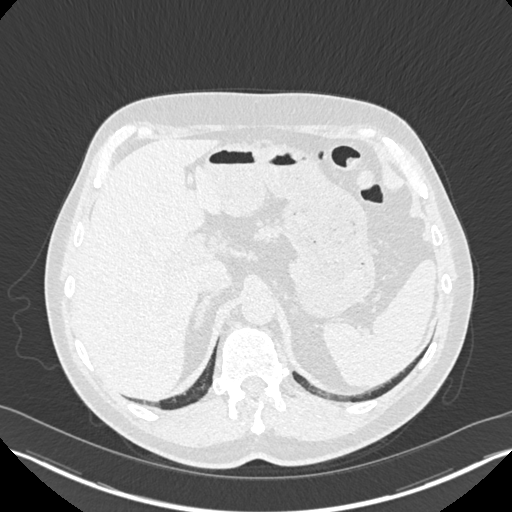
[im 79/395  lung]
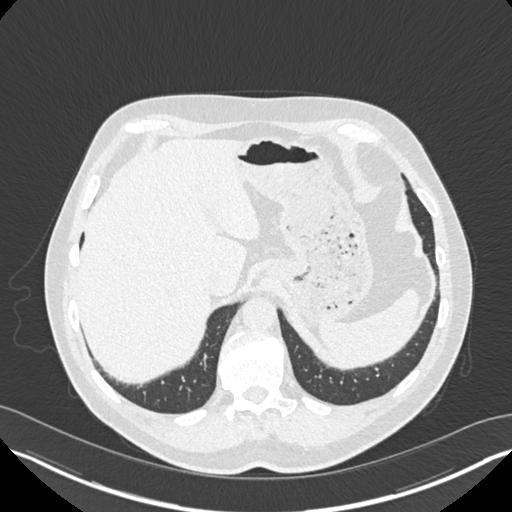
[im 132/395  lung]
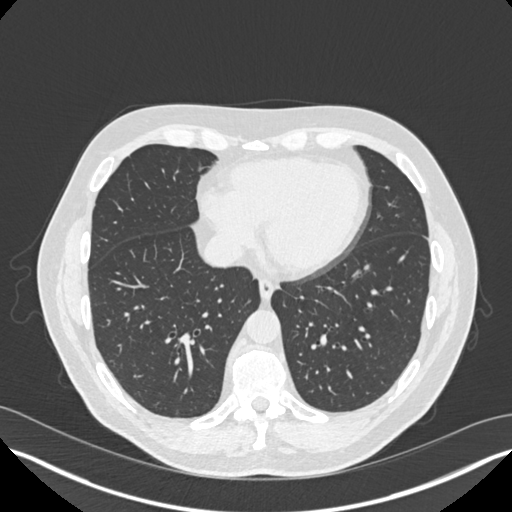
[im 158/395  mediastinal]
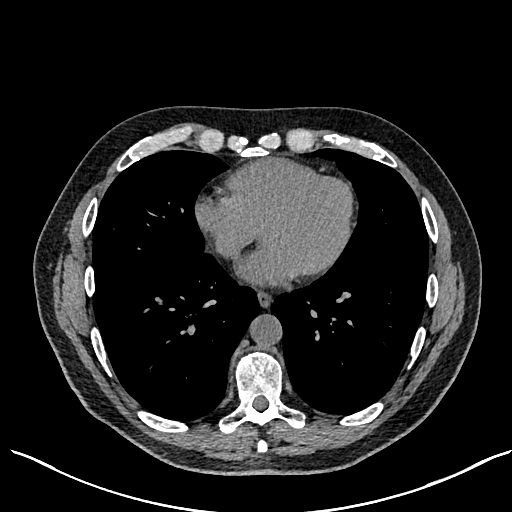
[im 158/395  lung]
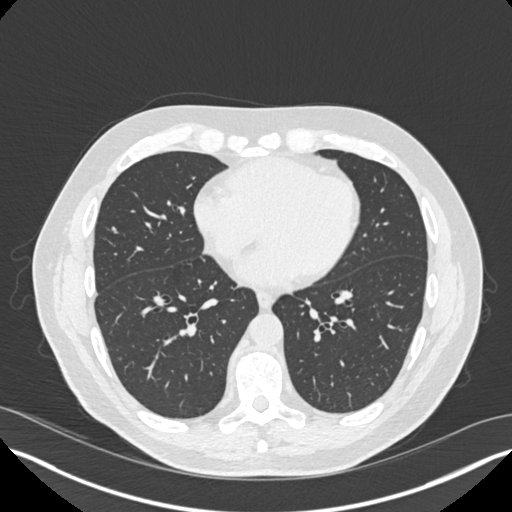
[im 184/395  lung]
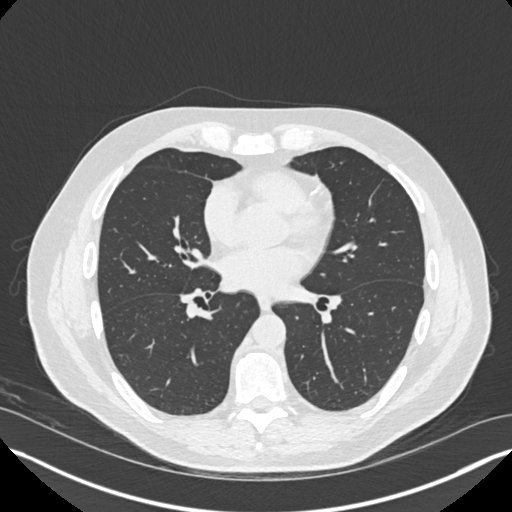
[im 211/395  lung]
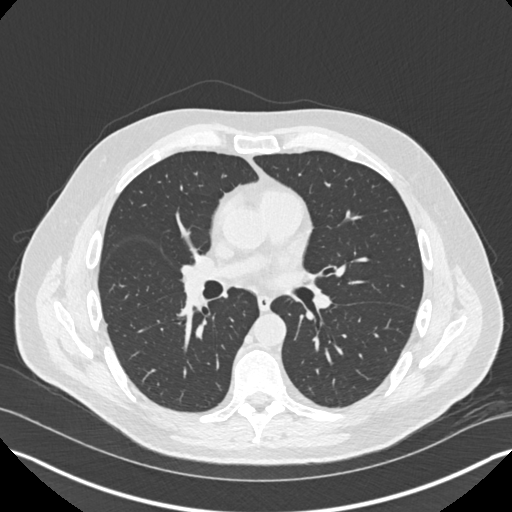
[im 237/395  lung]
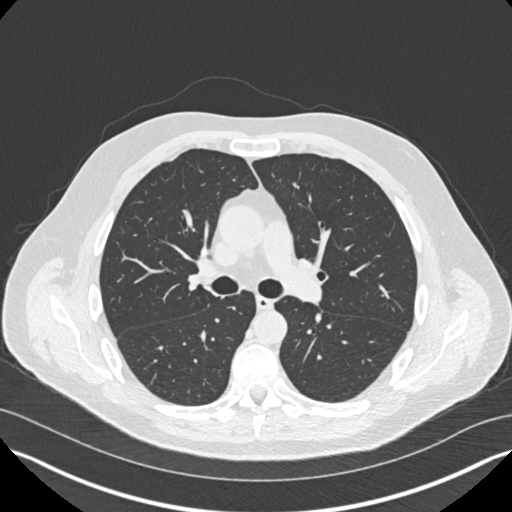
[im 263/395  mediastinal]
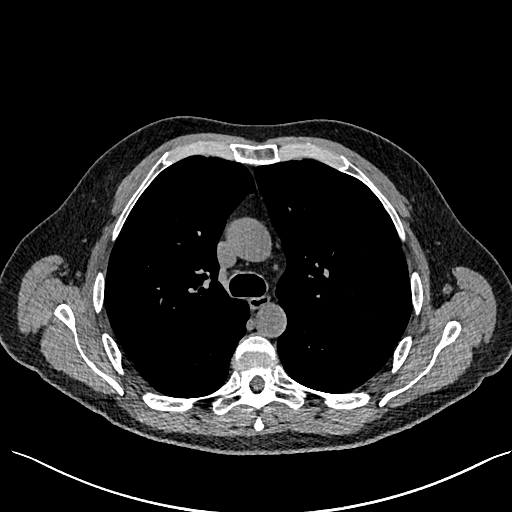
[im 263/395  lung]
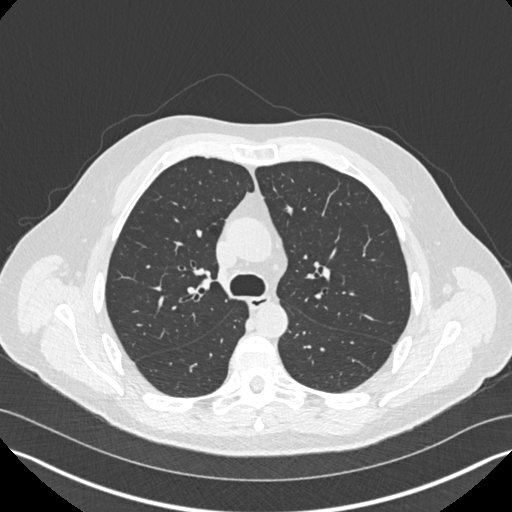
[im 316/395  lung]
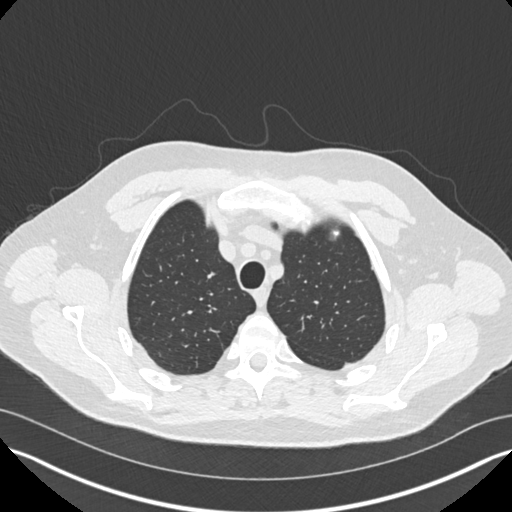
[im 342/395  lung]
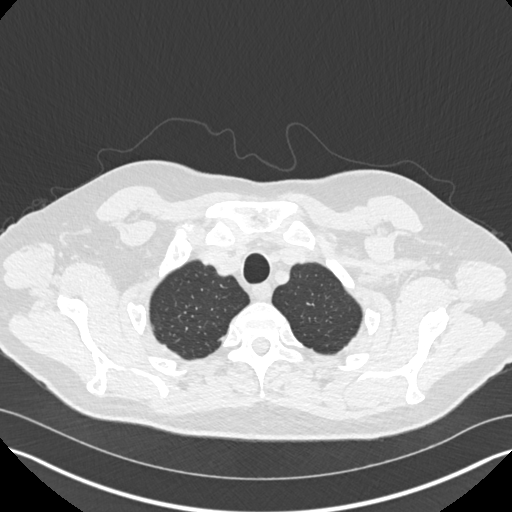
[im 368/395  lung]
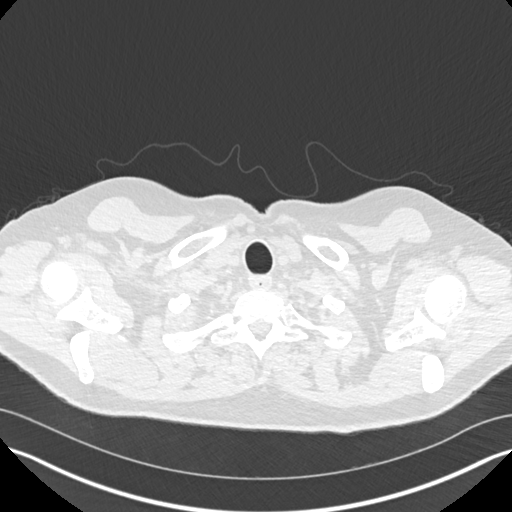

[15 of 36 positions shown; findings below may reference images not displayed]

FINDINGS: Cardiovascular: Aortic atherosclerosis. Normal heart size. Left
coronary artery calcifications. No pericardial effusion.

Mediastinum/Nodes: No enlarged mediastinal, hilar, or axillary lymph
nodes. Thyroid gland, trachea, and esophagus demonstrate no
significant findings.

Lungs/Pleura: Occasional small bilateral pulmonary nodules are
stable and definitively benign, for example a 0.5 cm nodule of the
peripheral left lower lobe (series 8, image 107) and a 0.3 cm nodule
of the lateral segment right middle lobe (series 8, image 97). No
pleural effusion or pneumothorax.

Upper Abdomen: No acute abnormality. Definitively benign,
macroscopic fat containing left adrenal adenoma, for which no
further follow-up or characterization is required (series 2, image
143).

Musculoskeletal: No chest wall abnormality. No suspicious osseous
lesions identified.
IMPRESSION: 1. Occasional small bilateral pulmonary nodules are stable and
definitively benign. No further follow-up or characterization is
required.
2. Coronary artery disease.

Aortic Atherosclerosis ([id number]).

## 2022-08-25 ENCOUNTER — Telehealth (HOSPITAL_COMMUNITY): Payer: Self-pay | Admitting: Surgery

## 2022-08-25 ENCOUNTER — Other Ambulatory Visit (HOSPITAL_COMMUNITY): Payer: Self-pay | Admitting: Surgery

## 2022-08-25 DIAGNOSIS — J029 Acute pharyngitis, unspecified: Secondary | ICD-10-CM

## 2022-08-25 MED ORDER — AZITHROMYCIN 250 MG PO TABS: ORAL_TABLET | ORAL | 0 refills | Status: AC

## 2022-08-25 NOTE — Telephone Encounter (Signed)
     Patient seen and examined.  Persistent symptoms of acute pharyngitis for >4 weeks with developing cough.  Plan [place], MD [facility] [place] practice Office: [phone]

## 2022-08-30 ENCOUNTER — Encounter: Payer: Self-pay | Admitting: Radiology

## 2022-09-28 ENCOUNTER — Other Ambulatory Visit: Payer: Self-pay | Admitting: Internal Medicine

## 2022-09-28 DIAGNOSIS — E782 Mixed hyperlipidemia: Secondary | ICD-10-CM

## 2022-10-01 DIAGNOSIS — E782 Mixed hyperlipidemia: Secondary | ICD-10-CM | POA: Diagnosis not present

## 2022-10-02 LAB — NMR, LIPOPROFILE
Cholesterol, Total: 148 mg/dL (ref 100–199)
HDL Particle Number: 37.2 umol/L (ref >=30.5)
HDL-C: 80 mg/dL (ref >39)
LDL Particle Number: 464 nmol/L (ref <1000)
LDL Size: 21 nm (ref >20.5)
LDL-C (NIH Calc): 54 mg/dL (ref 0–99)
LP-IR Score: <25 (ref <=45)
Small LDL Particle Number: <90 nmol/L (ref <=527)
Triglycerides: 69 mg/dL (ref 0–149)

## 2022-10-04 ENCOUNTER — Ambulatory Visit: Payer: Medicare HMO | Admitting: Internal Medicine

## 2022-10-04 ENCOUNTER — Encounter: Payer: Self-pay | Admitting: Internal Medicine

## 2022-11-08 DIAGNOSIS — E782 Mixed hyperlipidemia: Secondary | ICD-10-CM | POA: Diagnosis not present

## 2022-11-08 DIAGNOSIS — Z7189 Other specified counseling: Secondary | ICD-10-CM | POA: Diagnosis not present

## 2022-11-08 DIAGNOSIS — I2510 Atherosclerotic heart disease of native coronary artery without angina pectoris: Secondary | ICD-10-CM | POA: Diagnosis not present

## 2022-11-08 DIAGNOSIS — I10 Essential (primary) hypertension: Secondary | ICD-10-CM | POA: Diagnosis not present

## 2022-11-08 DIAGNOSIS — Z0000 Encounter for general adult medical examination without abnormal findings: Secondary | ICD-10-CM | POA: Diagnosis not present

## 2022-11-08 DIAGNOSIS — Z125 Encounter for screening for malignant neoplasm of prostate: Secondary | ICD-10-CM | POA: Diagnosis not present

## 2022-11-08 DIAGNOSIS — J309 Allergic rhinitis, unspecified: Secondary | ICD-10-CM | POA: Diagnosis not present

## 2022-11-08 DIAGNOSIS — I700 Atherosclerosis of aorta: Secondary | ICD-10-CM | POA: Diagnosis not present

## 2022-11-08 DIAGNOSIS — D692 Other nonthrombocytopenic purpura: Secondary | ICD-10-CM | POA: Diagnosis not present

## 2022-11-08 DIAGNOSIS — N528 Other male erectile dysfunction: Secondary | ICD-10-CM | POA: Diagnosis not present

## 2022-11-08 DIAGNOSIS — R7303 Prediabetes: Secondary | ICD-10-CM | POA: Diagnosis not present

## 2022-12-03 ENCOUNTER — Other Ambulatory Visit: Payer: Self-pay | Admitting: Internal Medicine

## 2022-12-10 DIAGNOSIS — H00014 Hordeolum externum left upper eyelid: Secondary | ICD-10-CM | POA: Diagnosis not present

## 2022-12-10 DIAGNOSIS — H00011 Hordeolum externum right upper eyelid: Secondary | ICD-10-CM | POA: Diagnosis not present

## 2022-12-24 DIAGNOSIS — H10503 Unspecified blepharoconjunctivitis, bilateral: Secondary | ICD-10-CM | POA: Diagnosis not present

## 2022-12-24 DIAGNOSIS — H00014 Hordeolum externum left upper eyelid: Secondary | ICD-10-CM | POA: Diagnosis not present

## 2022-12-24 DIAGNOSIS — H00011 Hordeolum externum right upper eyelid: Secondary | ICD-10-CM | POA: Diagnosis not present

## 2023-01-25 ENCOUNTER — Ambulatory Visit: Payer: Medicare HMO | Attending: Internal Medicine | Admitting: Internal Medicine

## 2023-01-25 ENCOUNTER — Encounter: Payer: Self-pay | Admitting: Internal Medicine

## 2023-01-25 VITALS — BP 128/72 | HR 65 | Ht 70.0 in | Wt 168.8 lb

## 2023-01-25 DIAGNOSIS — I2584 Coronary atherosclerosis due to calcified coronary lesion: Secondary | ICD-10-CM

## 2023-01-25 DIAGNOSIS — E785 Hyperlipidemia, unspecified: Secondary | ICD-10-CM | POA: Diagnosis not present

## 2023-01-25 DIAGNOSIS — I2510 Atherosclerotic heart disease of native coronary artery without angina pectoris: Secondary | ICD-10-CM | POA: Diagnosis not present

## 2023-01-25 DIAGNOSIS — I10 Essential (primary) hypertension: Secondary | ICD-10-CM

## 2023-01-25 NOTE — Progress Notes (Signed)
LIPID CLINIC NOTE  Chief Complaint:  Follow-up dyslipidemia  Primary Care Physician: Lupita Raider, MD  Primary Cardiologist:  None  HPI:  Thomas Roach is a 72 y.o. male who is being seen today for the evaluation of dyslipidemia at the request of Lupita Raider, MD. this is a pleasant 72 year old male kindly referred for evaluation management of dyslipidemia, hypertension, prediabetes and an abnormal coronary calcium score.  He underwent calcium scoring in May 2022 which demonstrated a total score of 217, 59th percentile for age and matched controls.  Based on these abnormal findings he was referred for further evaluation and management.  Calcium was noted on that study to be in the LAD and circumflex arteries.  He does report family history of cardiovascular disease including his father who died of a stroke in his 68s but his mother lived to 59.  Most recent labs showed total cholesterol 160, triglycerides 60, HDL 74 and LDL of 73.  07/13/2021  Thomas Roach returns today for follow-up.  There is been some discrepancy between the labs he has gotten at [facility] versus through [facility].  Most recently I ordered a lipid NMR which showed LDL-P of 934, total cholesterol 100, HDL 70 and triglycerides 184.  This is now improved to LDL-P of 754, LDL-C 64, HDL 68 and triglycerides 144, in response to an increase in his simvastatin up to 20 mg.  He is now at target.  He was noted to have coronary calcium which was the reason for referral as above and found to have some subcentimeter pulmonary nodules.  Follow-up was recommended if he is considered high risk but generally I would recommend a follow-up CT either way.  He did undergo genetic testing for FH which showed no pathogenic mutations although he did have a variant of unknown significance which increases his risk of heart disease and 3 mutations that were mixed regarding risk for obesity.  01/25/2023  Thomas Roach returns today for follow-up.  He says he  is feeling very well.  He is implemented a number of dietary changes and has had a significant 15 pound or more weight loss.  This is translated to improve lipids.  His particle numbers come down to 464 from 754 with an LDL of 54, HDL of 80 and triglycerides 69.  He is also walking he says at least about 5 miles a day.  He continues on simvastatin.  His cholesterol is well treated although he would like to try to drive the LDL below 50.  Nonetheless since he is not really at very high risk I think he is at target.  Recently his PCP had decreased his blood pressure medicine and he maintains a normal blood pressure at this point.  PMHx:  Past Medical History:  Diagnosis Date   Dyslipidemia    Elevated coronary artery calcium score    Hypertension    Prediabetes     No past surgical history on file.  FAMHx:  Family History  Problem Relation Age of Onset   Stroke Father     SOCHx:   reports that he has never smoked. He has never used smokeless tobacco. He reports current alcohol use. He reports that he does not use drugs.  ALLERGIES:  No Known Allergies  ROS: Pertinent items noted in HPI and remainder of comprehensive ROS otherwise negative.  HOME MEDS: Current Outpatient Medications on File Prior to Visit  Medication Sig Dispense Refill   aspirin 81 MG EC tablet Take 1 tablet by mouth  daily at 12 noon.     lisinopril-hydrochlorothiazide (ZESTORETIC) 20-12.5 MG tablet Take 1 tablet by mouth daily.     loratadine (CLARITIN) 10 MG tablet Take 10 mg by mouth daily as needed for allergies.     simvastatin (ZOCOR) 20 MG tablet TAKE ONE TABLET BY MOUTH DAILY AT BEDTIME 90 tablet 0   Zoster Vaccine Adjuvanted [facility]) injection Inject into the muscle. 0.5 mL 1   methylPREDNISolone (MEDROL) 4 MG tablet Take as directed per box instructions 21 tablet 0   No current facility-administered medications on file prior to visit.    LABS/IMAGING: No results found for this or any previous visit  (from the past 48 hour(s)). No results found.  LIPID PANEL: No results found for: "CHOL", "TRIG", "HDL", "CHOLHDL", "VLDL", "LDLCALC", "LDLDIRECT"  WEIGHTS: Wt Readings from Last 3 Encounters:  01/25/23 168 lb 12.8 oz (76.6 kg)  07/13/21 172 lb 3.2 oz (78.1 kg)  03/27/21 170 lb (77.1 kg)    VITALS: BP 128/72 (BP Location: Left Arm, Patient Position: Sitting, Cuff Size: Normal)   Pulse 65   Ht 5\' 10"  (1.778 m)   Wt 168 lb 12.8 oz (76.6 kg)   SpO2 99%   BMI 24.22 kg/m   EXAM: Deferred  EKG: Deferred  ASSESSMENT: Mixed dyslipidemia, goal LDL less than 70 Multivessel coronary calcium-CAC score 217, 59th percentile (10/2020) Subcentimeter pulmonary nodules Hypertension Prediabetes  PLAN: 1.   Thomas Roach continues to be well treated with regards to his lipids.  He has had substantial improvement recently with increased exercise and dietary changes as well as weight loss.  I would not advise any further changes to his regimen.  Blood pressure is well-controlled.  He did have coronary calcification which was at the 59th percentile back in 2022.  Plan follow-up with me annually or sooner as necessary.  Will repeat lipids in 6 months.  Chrystie Nose, MD, [facility], FACP  [place]  [facility]  Medical Director of the [facility] Lipid [facility] &  Cardiovascular Risk Reduction Clinic Diplomate of the American Board of Clinical Lipidology Attending Cardiologist  Direct Dial: [phone]  Fax: [phone]  Website:  [email or web address] Nicely Raylen Ken 01/25/2023, 12:08 PM

## 2023-01-25 NOTE — Patient Instructions (Signed)
Medication Instructions:  NO CHANGES  *If you need a refill on your cardiac medications before your next appointment, please call your pharmacy*   Lab Work: FASTING lab work in 6 months  If you have labs (blood work) drawn today and your tests are completely normal, you will receive your results only by: [employer] (if you have MyChart) OR A paper copy in the mail If you have any lab test that is abnormal or we need to change your treatment, we will call you to review the results.   Follow-Up: At [facility], you and your health needs are our priority.  As part of our continuing mission to provide you with exceptional heart care, we have created designated Provider Care Teams.  These Care Teams include your primary Cardiologist (physician) and Advanced Practice Providers (APPs -  Physician Assistants and Nurse Practitioners) who all work together to provide you with the care you need, when you need it.  We recommend signing up for the patient portal called "MyChart".  Sign up information is provided on this After Visit Summary.  MyChart is used to connect with patients for Virtual Visits (Telemedicine).  Patients are able to view lab/test results, encounter notes, upcoming appointments, etc.  Non-urgent messages can be sent to your provider as well.   To learn more about what you can do with MyChart, go to [email or web address].    Your next appointment:    12 months with Dr. Rennis Golden

## 2023-02-28 DIAGNOSIS — H1789 Other corneal scars and opacities: Secondary | ICD-10-CM | POA: Diagnosis not present

## 2023-02-28 DIAGNOSIS — Z961 Presence of intraocular lens: Secondary | ICD-10-CM | POA: Diagnosis not present

## 2023-02-28 DIAGNOSIS — H353131 Nonexudative age-related macular degeneration, bilateral, early dry stage: Secondary | ICD-10-CM | POA: Diagnosis not present

## 2023-03-08 ENCOUNTER — Ambulatory Visit: Payer: Medicare HMO | Admitting: Internal Medicine

## 2023-03-10 ENCOUNTER — Other Ambulatory Visit: Payer: Self-pay | Admitting: Internal Medicine

## 2023-04-27 DIAGNOSIS — Z23 Encounter for immunization: Secondary | ICD-10-CM | POA: Diagnosis not present

## 2023-05-03 ENCOUNTER — Ambulatory Visit: Payer: Medicare HMO | Admitting: Internal Medicine

## 2023-06-08 ENCOUNTER — Other Ambulatory Visit: Payer: Self-pay | Admitting: Internal Medicine

## 2023-08-06 LAB — NMR, LIPOPROFILE
Cholesterol, Total: 158 mg/dL (ref 100–199)
HDL Particle Number: 36.6 umol/L (ref >=30.5)
HDL-C: 81 mg/dL (ref >39)
LDL Particle Number: 488 nmol/L (ref <1000)
LDL Size: 21.2 nmol (ref >20.5)
LDL-C (NIH Calc): 64 mg/dL (ref 0–99)
LP-IR Score: 37 (ref <=45)
Small LDL Particle Number: <90 nmol/L (ref <=527)
Triglycerides: 65 mg/dL (ref 0–149)

## 2023-11-04 ENCOUNTER — Encounter: Payer: Self-pay | Admitting: Internal Medicine

## 2023-11-13 ENCOUNTER — Encounter: Payer: Self-pay | Admitting: Family Medicine

## 2023-11-13 LAB — LAB REPORT - SCANNED
A1c: 5.3
Creatinine, POC: 66 mg/dL
EGFR: 91
Microalb Creat Ratio: 11.1
Microalbumin, Urine: 0.73
PSA, FREE: 0.96

## 2023-11-27 DIAGNOSIS — Z0000 Encounter for general adult medical examination without abnormal findings: Secondary | ICD-10-CM | POA: Diagnosis not present

## 2024-03-12 DIAGNOSIS — E782 Mixed hyperlipidemia: Secondary | ICD-10-CM | POA: Diagnosis not present

## 2024-03-13 ENCOUNTER — Ambulatory Visit: Payer: Self-pay | Admitting: Internal Medicine

## 2024-05-27 ENCOUNTER — Other Ambulatory Visit (HOSPITAL_COMMUNITY): Payer: Self-pay

## 2024-05-27 MED ORDER — FLUZONE HIGH-DOSE 0.5 ML IM SUSY
PREFILLED_SYRINGE | INTRAMUSCULAR | 0 refills | Status: AC
  Filled 2024-05-27: qty 0.5, 1d supply, fill #0

## 2024-07-15 ENCOUNTER — Other Ambulatory Visit (HOSPITAL_COMMUNITY): Payer: Self-pay
